# Patient Record
Sex: Male | Born: 1970 | Race: Black or African American | Hispanic: No | Marital: Single | Smoking: Never smoker
Health system: Southern US, Community
[De-identification: ages and names within clinical notes are randomized; demographics above are authoritative.]

## PROBLEM LIST (undated history)

## (undated) ENCOUNTER — Ambulatory Visit (HOSPITAL_COMMUNITY): Admission: EM | Payer: Managed Care, Other (non HMO)

---

## 1999-08-13 ENCOUNTER — Emergency Department (HOSPITAL_COMMUNITY): Admission: EM | Admit: 1999-08-13 | Discharge: 1999-08-13 | Payer: Self-pay | Admitting: *Deleted

## 2003-07-17 ENCOUNTER — Other Ambulatory Visit: Payer: Self-pay

## 2005-06-04 ENCOUNTER — Emergency Department (HOSPITAL_COMMUNITY): Admission: EM | Admit: 2005-06-04 | Discharge: 2005-06-04 | Payer: Self-pay | Admitting: Emergency Medicine

## 2007-07-03 ENCOUNTER — Ambulatory Visit (HOSPITAL_COMMUNITY): Admission: RE | Admit: 2007-07-03 | Discharge: 2007-07-03 | Payer: Self-pay | Admitting: Orthopedic Surgery

## 2007-10-11 ENCOUNTER — Ambulatory Visit: Payer: Self-pay | Admitting: Internal Medicine

## 2007-10-11 ENCOUNTER — Inpatient Hospital Stay (HOSPITAL_COMMUNITY): Admission: RE | Admit: 2007-10-11 | Discharge: 2007-10-12 | Payer: Self-pay | Admitting: Orthopedic Surgery

## 2007-10-19 ENCOUNTER — Ambulatory Visit: Payer: Self-pay | Admitting: Pulmonary Disease

## 2007-10-19 DIAGNOSIS — J209 Acute bronchitis, unspecified: Secondary | ICD-10-CM

## 2007-10-19 DIAGNOSIS — J8409 Other alveolar and parieto-alveolar conditions: Secondary | ICD-10-CM | POA: Insufficient documentation

## 2009-09-17 IMAGING — CR DG LUMBAR SPINE 2-3V
2 series · 2 of 2 positions shown · non-contrast
Comparison: Lumbar MRI performed on 09/07/2007 at [REDACTED].

CLINICAL DATA: Herniated L5 - S1 disc.  Low back pain.

LUMBAR SPINE - 2-3 VIEW

[t l-spine a.p.]
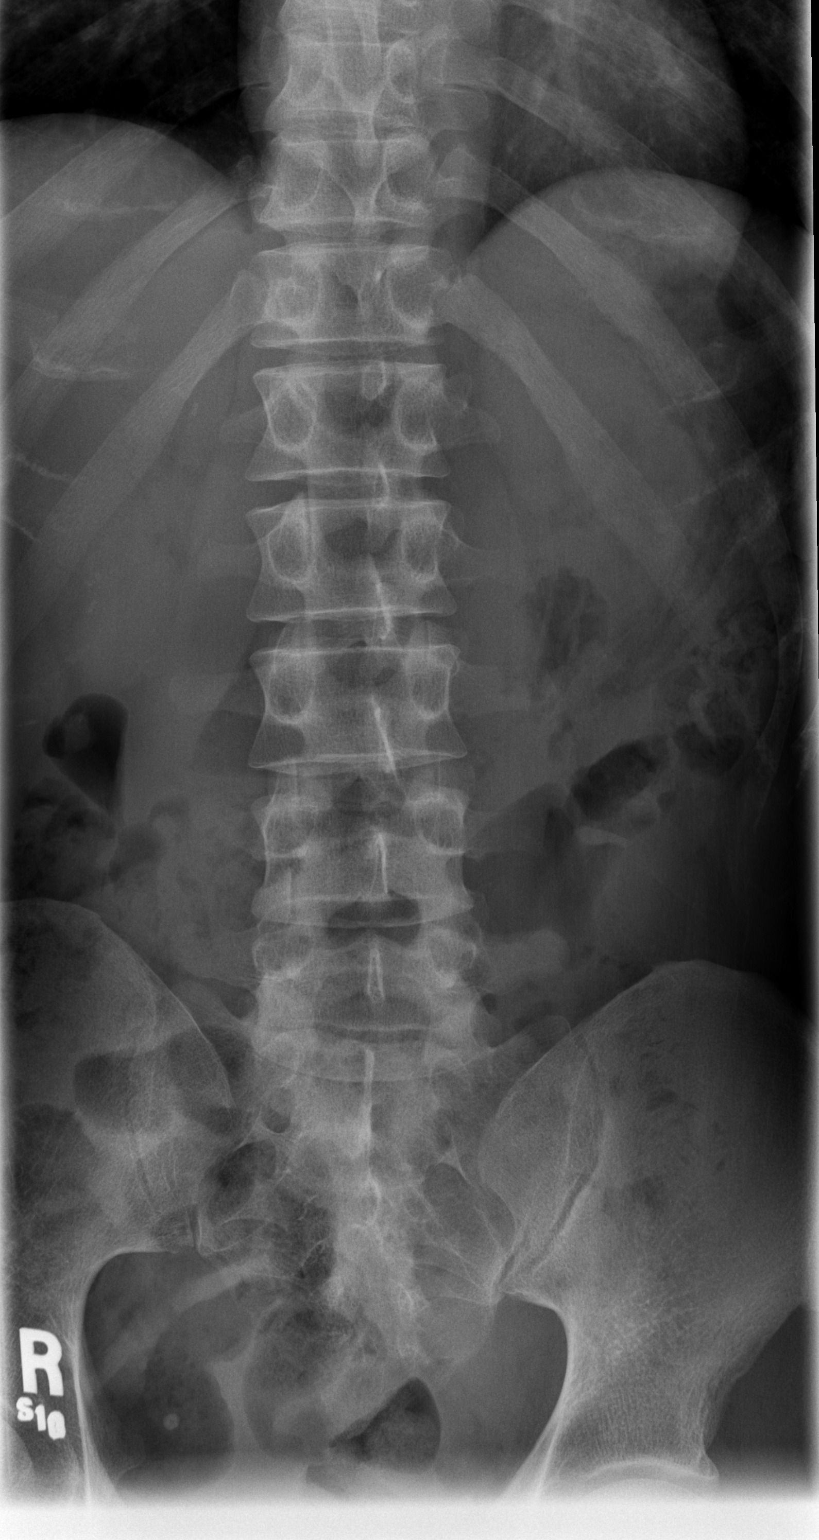

[t l-spine lat]
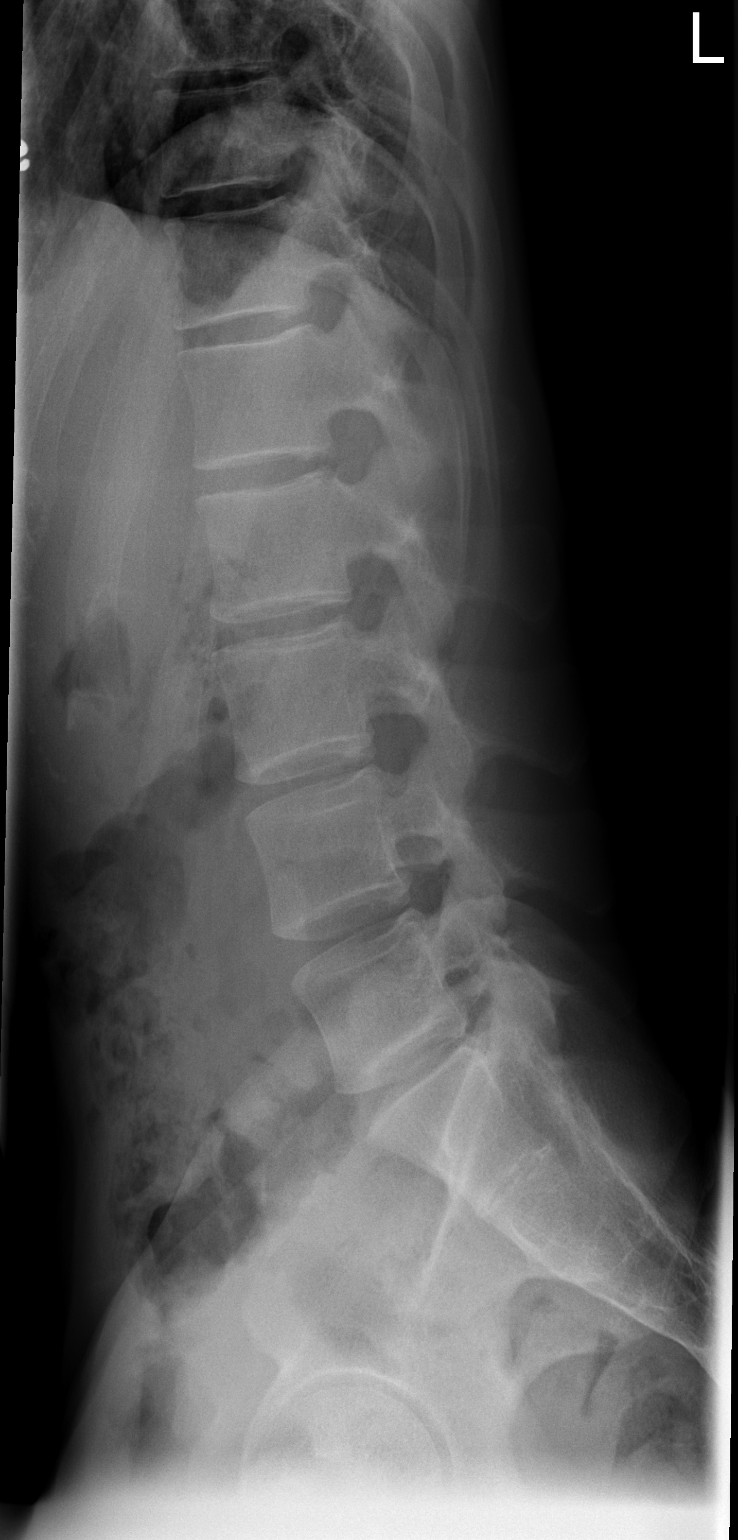

[2 of 2 positions shown; findings below may reference images not displayed]

FINDINGS: I have labeled the AP and lateral radiographs using the
same numbering sequence as utilized for the interpretation of the
lumbar MRI.

There are five typical lumbar segments with no spondylolisthesis or
spondylolysis.  No disc space narrowing or appreciable facet joint
disease.
IMPRESSION: Normal lumbar spine.  Radiographs labeled as described.

## 2009-09-17 IMAGING — CR DG OR PORTABLE SPINE
1 series · 1 of 1 positions shown · non-contrast
Comparison: Previous intraoperative film of same date.

CLINICAL DATA: Right disc extrusion at L5-S1

PORTABLE SPINE

[view not recorded]
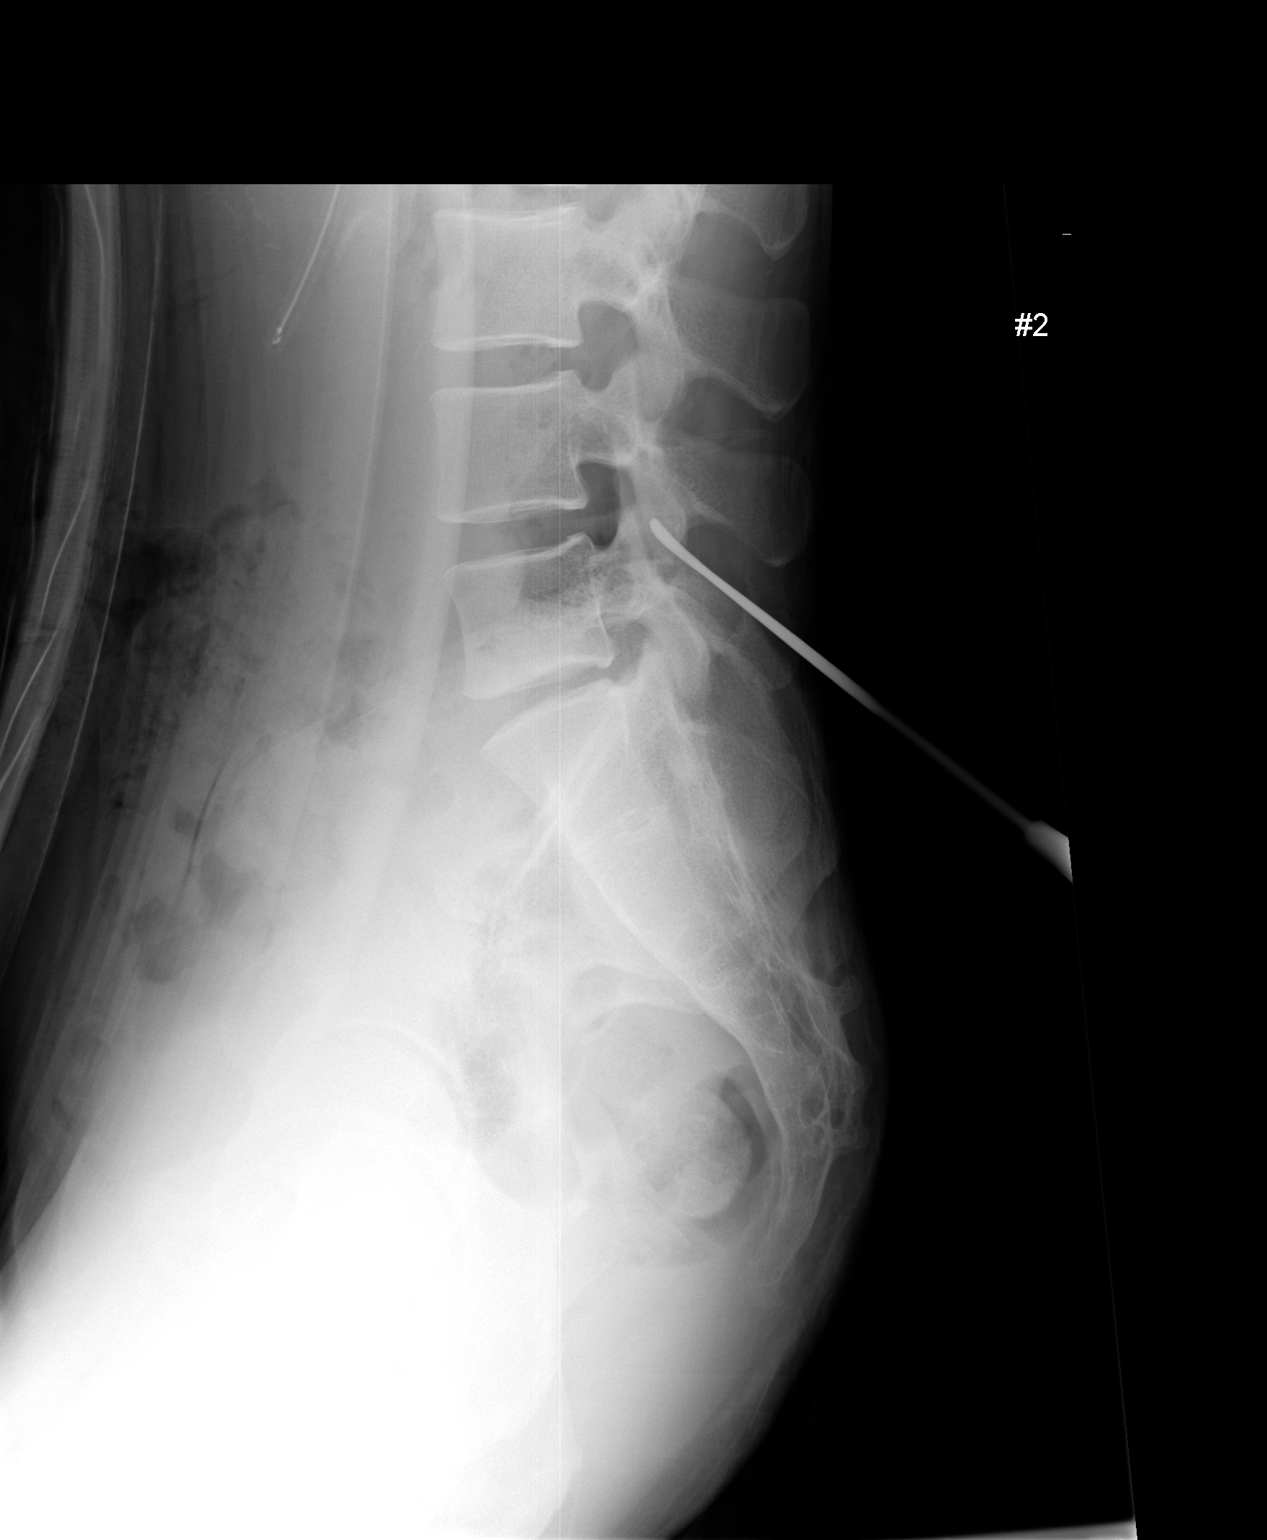

[1 of 1 positions shown; findings below may reference images not displayed]

FINDINGS: A surgical probe was directed at the L4-5 level.  The
vertebral bodies are numbered.
IMPRESSION: The surgical probe is directed at the L4-5 level.

## 2009-09-17 IMAGING — CR DG OR PORTABLE SPINE
1 series · 1 of 1 positions shown · non-contrast
Comparison: Earlier intraoperative exams from the same day.

CLINICAL DATA: 37-year-old male undergoing lumbar spine surgery.

PORTABLE SPINE

[view not recorded]
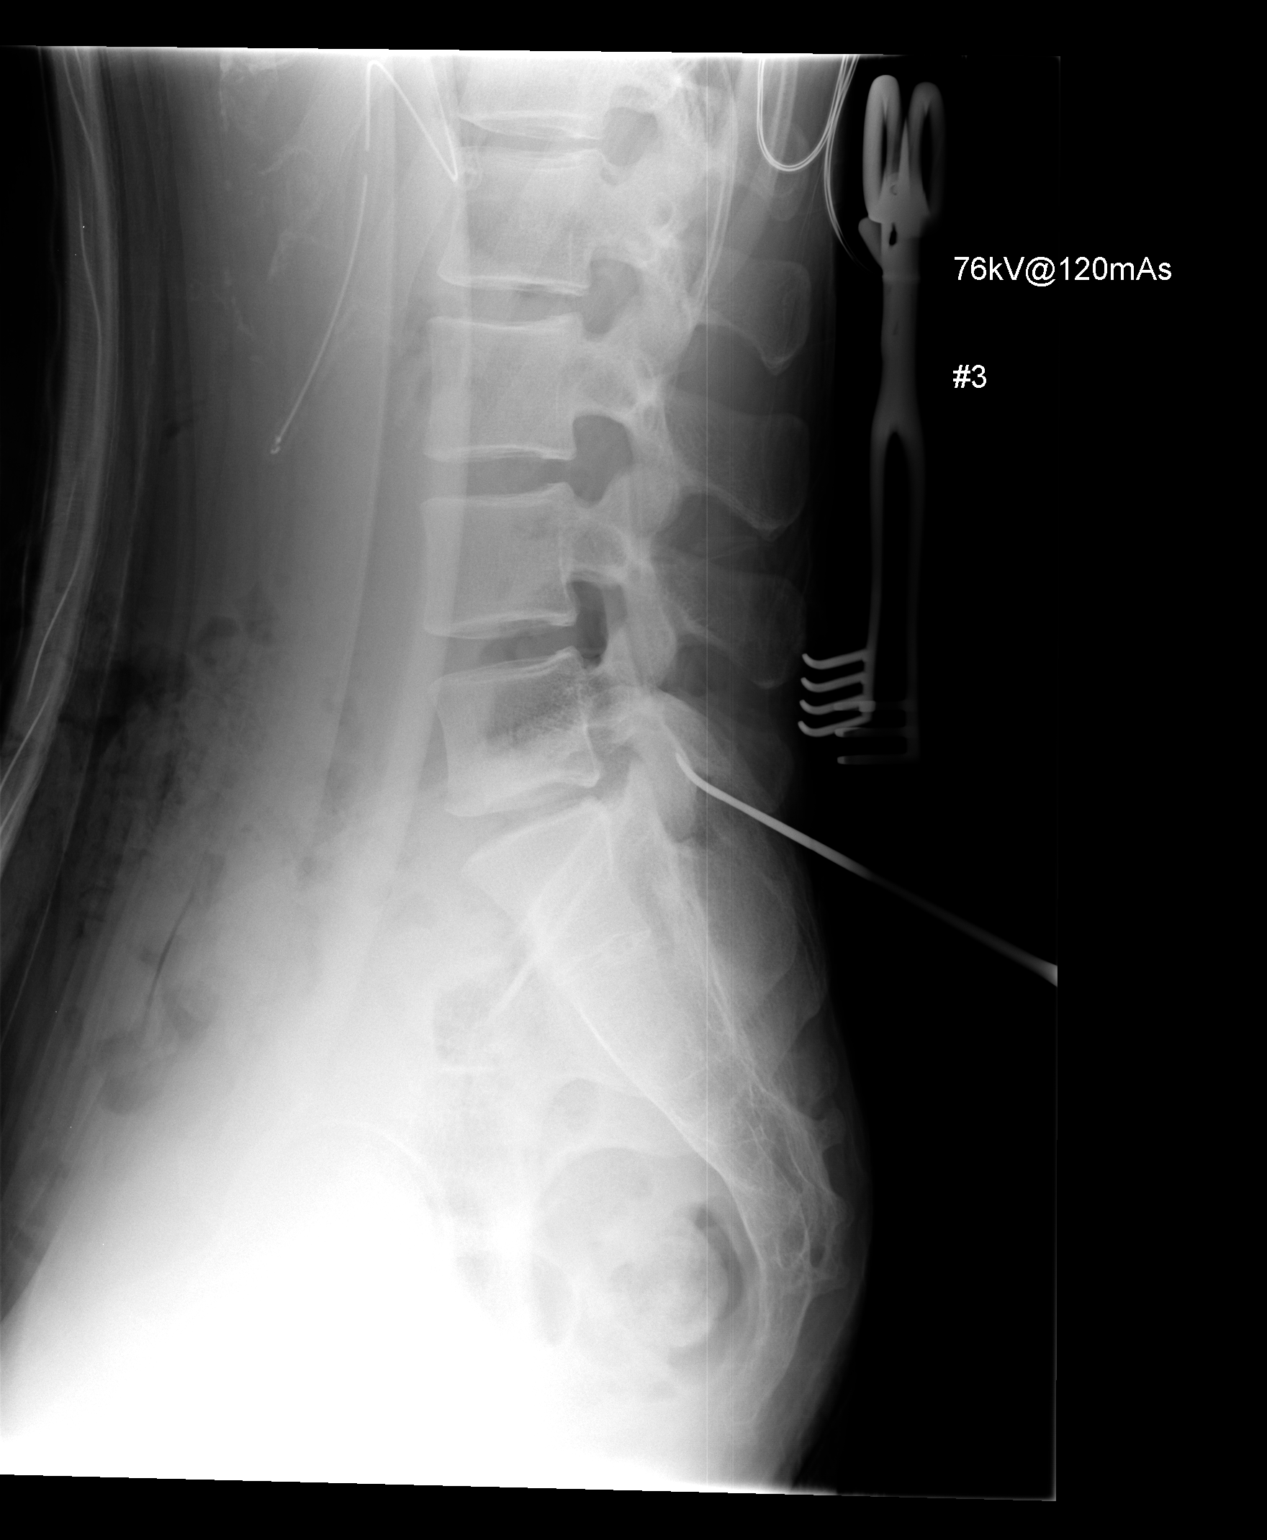

[1 of 1 positions shown; findings below may reference images not displayed]

FINDINGS: Portable lateral intraoperative view the spine labeled #3
at 7253 hours.

Surgical probe now projects over the L5 inferior articulating facet
at the level of the L5-S1 disc space.
IMPRESSION: Intraoperative localization as above.

## 2009-09-17 IMAGING — CR DG OR PORTABLE SPINE
1 series · 1 of 1 positions shown · non-contrast
Comparison: Plain film radiographs of same date.  I do not have any
cross sectional imaging with which to compare.

CLINICAL DATA: Right-sided disc extrusion at L5-S1.

PORTABLE SPINE

[view not recorded]
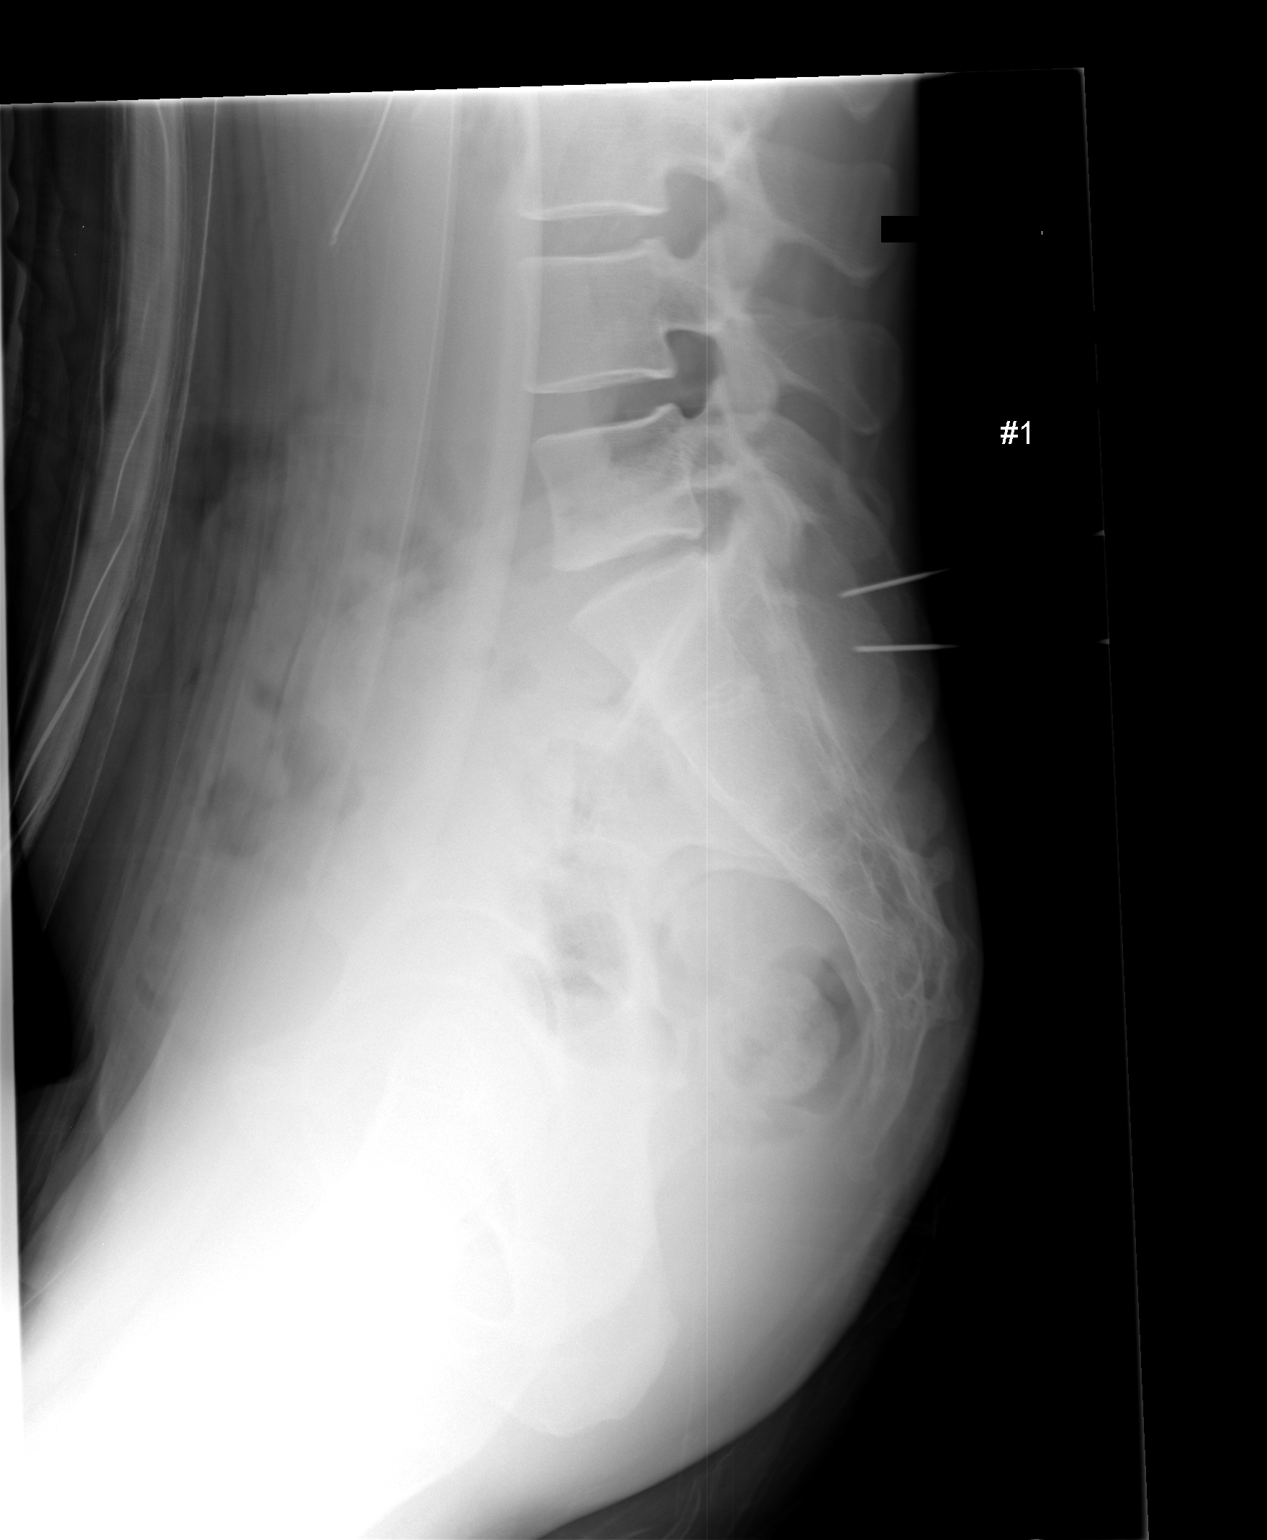

[1 of 1 positions shown; findings below may reference images not displayed]

FINDINGS: Using the same numbering system as was used on the
previous plain film radiographs, there are two needles directed at
the body of S1 and the S1-2 level respectively.
IMPRESSION: 1.  Needles are directed at S1 and S1-2 interspaces respectively.

## 2010-07-28 NOTE — Op Note (Signed)
NAME:  Theodore Young, Theodore Young                ACCOUNT NO.:  [id number]   MEDICAL RECORD NO.:  [id number]          PATIENT TYPE:  OIB   LOCATION:  0098                         FACILITY:  [facility]   PHYSICIAN:  Alvy Beal, MD    DATE OF BIRTH:  [birth year]   DATE OF PROCEDURE:  DATE OF DISCHARGE:                               OPERATIVE REPORT   PREOPERATIVE DIAGNOSIS:  Right paracentral disk herniation, L5-S1.   POSTOPERATIVE DIAGNOSIS:  Right paracentral disk herniation, L5-S1.   OPERATIVE PROCEDURE:  Right sided L5-S1 lumbar hemilaminectomy for  diskectomy, CPT code 62952.   COMPLICATIONS:  None.   INTRAOPERATIVE FINDINGS:  Significant posterolateral disk herniation  removed under direct visualization.  No CSF leak post removal, no direct  neural injury.   OPERATIVE INDICATIONS:  The patient is a very pleasant 40 year old  gentleman who presented to my office some time ago after a work-related  injury, resulted in significant back and left leg pain.  Initially, his  MRI did not confirm a left-sided disk herniation, and he was treated  appropriately conservatively.  However, he then developed severe  unrelenting right leg pain and a repeat MRI was done that demonstrated a  larger paracentral disk herniation posterolateral to the right at L5-S1  with increased pressure against the L5-S1 traversing nerve root.  After  discussing treatment options, we elected to proceed with surgery.  All  appropriate conservative measures, including physiotherapy, narcotic  medications and injection therapy were tried and failed.   FIRST ASSISTANT:  Crissie Reese, PA.   COMPLICATIONS:  None.   OPERATIVE NOTE:  The patient was brought to the operating room and  placed supine on the operating table.  After the successful induction of  general anesthesia, endotracheal intubation, TEDs and SCDs were applied,  he was turned prone onto a Wilson frame.  All bony prominences were well-  padded.  Two needles  were placed into the skin to mark for skin  incision.  I made a skin incision in the midline centered over the L5-S1  disk space.  Sharp dissection was carried out down to the deep fascia.  The deep fascia was sharply incised on the right-hand side, and I  exposed the L5-S1 interlaminar space.  I placed a Penfield IV underneath  the lamina and took an x-ray.  At this point, I realized that I was one  level off, and I was underneath the L4 lamina.  I then adjusted my  dissection, exposed the true L5-S1 interbody space, put a Penfield IV  underneath the L5 lamina and repeated the x-ray.  This time, I confirmed  that I was at the L5 lamina at the L5-S1 disk space.  At this point, a  Taylor retractor was placed to retract and I performed a slight  hemilaminotomy of L5 with a 3-mm Kerrison.  I then resected the  ligamentum flavum by removing it from the insertion site off of S1, and  then exposed the lateral aspect of the thecal sac.  Using a Penfield IV,  I dissected down the lateral gutter.  I had noted posterior  and medial  displacement of the traversing S1 nerve root at this time.  I could  easily sweep the thecal sac and displaced nerve root medially and  protect it with a D'Errico.  I then coagulated the traversing epidural  veins with bipolar and then incised the annulus using a combination of  pituitary rongeurs and reversed angled Epstein curettes.  I resected the  fragments of disk that had herniated posteriorly.  At this point, I then  used a micro-pituitary rongeur to enter into the actual interbody space  to remove any loose fragments of disk material that were still within  the disk space itself.  At this point, I then took a nerve hook and I  was able to sweep in a circumferential manner underneath the annulus,  confirming that there was no retained free fragments that were still  evident.  I then swept between the annulus and thecal sac, confirming  that there was no retained  fragments of material.  I then took a dural  elevator (hockeystick) and I was able to pass down the lateral recess to  the exiting S1 nerve root and neural foramen to confirm that there was  no significant pressure on the traversing S1 nerve root at the disk  level of the disk space and caudal.  I then swept cranially laterally  and medially in a circumferential manner, confirming that there were no  free fragments of disk material.  Once I had an adequate diskectomy, I  then irrigated copiously with normal saline, obtained hemostasis using  bipolar and then maintained with a thrombin-soaked Gelfoam patty.  I  then closed the deep fascia with interrupted #1 Vicryl sutures,  superficial 2-0 Vicryl sutures and 3-0 Monocryl for the skin.  Steri-  Strips, dry dressing and a Mepilex was applied.  The patient was  extubated and transferred to the PACU without incident.  At the end of  the case, all needle and sponge counts were correct.      Alvy Beal, MD  Electronically Signed     DDB/MEDQ  D:  10/10/2007  T:  10/10/2007  Job:  [id number]

## 2010-07-28 NOTE — Discharge Summary (Signed)
NAME:  Theodore Young, Gibbon                ACCOUNT NO.:  [id number]   MEDICAL RECORD NO.:  [id number]          PATIENT TYPE:  INP   LOCATION:  1608                         FACILITY:  [facility]   PHYSICIAN:  Alvy Beal, MD    DATE OF BIRTH:  08-28-1970   DATE OF ADMISSION:  10/10/2007  DATE OF DISCHARGE:  10/12/2007                               DISCHARGE SUMMARY   ADMISSION DIAGNOSIS:  L5-S1 right herniated nucleus pulposus with right  leg pain.   DISCHARGE DIAGNOSES:  L5-S1 right herniated nucleus pulposus with right  leg pain with episode of pulmonary edema.   CONSULT:  One consult was obtained critical care and management.   PROCEDURE:  Right-sided L5-S1 lumbar hemilaminectomy for diskectomy.   BRIEF HISTORY:  The patient is a very pleasant 40 year old gentleman who  initially presented to Dr. Shon Baton office after a work related injury and  that resulted in a significant back and left leg pain.  Initially, his  MRI did not confirm the left-sided disk herniation and he was treated  appropriately and conservatively.  However, he did develop unrelenting  right leg pain and a repeat MRI was done that demonstrated a larger  paracentral disk herniation in posterolateral to the right at L5-S1 with  increased pressure against the L5-S1 transversing nerve root.  After  discussing treatment options, the patient elected to proceed with  surgery.  All appropriate conservative measures including physical  therapy, narcotic medications, and injections were tried and failed  prior to surgery.   HOSPITAL COURSE:  Hospital course was 2 days in length.  The patient did  well in the PACU was having some mild complications of shortness of  breath and an episode of pink frothy sputum.  A chest x-ray was  obtained.  It demonstrated some pulmonary edema.  Critical care was  consulted to help manage this patient.  The patient stayed one night in  the SICU and a repeat chest x-ray following two doses of  Lasix showed  that the pulmonary edema was resolving.  He was therefore deemed stable  to be transferred to the orthopedic floor. Postoperatively day #2, the  patient was tolerating a regular diet.  Had no shortness of breath.  His  O2 sats were 95% on room air.  The patient was able to ambulate without  assistance.  He was having regular bowel and bladder movements.  The  patient was therefore deemed stable to be discharged to home.  The  patient's discharge condition is going to be stable.   DISCHARGE MEDICATIONS:  1. Percocet 10/325 1 tablet p.o. q. 4-6 h. p.r.n. pain.  2. Robaxin 500 mg 1 tablet p.o. q.8 h. p.r.n. muscle spasms.   INSTRUCTIONS:  The patient was given a preprinted discharge instruction  form that included the restrictions of activity as walk as tolerated,  not to lift anything heavier than 8 pounds, he is not to drive until he  is not taking pain medications, he is to wear the brace out of bed for  comfort only, he can shower 2 days after surgery, he is to  follow up in  the office 2 weeks after surgery.  The patient is to call and schedule  his appointment.  It is also recommended  that the patient follow up with the [facility] so he can  have a primary care physician to follow up for his episode of pulmonary  edema.  The patient will receive the phone number and the patient needs  to make this appointment.      Crissie Reese, PA       ______________________________  Alvy Beal, MD    AC/MEDQ  D:  10/12/2007  T:  10/12/2007  Job:  [id number]

## 2010-12-11 LAB — CBC
Hemoglobin: 17.6 — ABNORMAL HIGH
MCHC: 34.8
MCV: 96.7
RBC: 5.24

## 2010-12-11 LAB — DIFFERENTIAL
Basophils Relative: 0
Eosinophils Absolute: 0
Monocytes Absolute: 0.5
Monocytes Relative: 3
Neutrophils Relative %: 93 — ABNORMAL HIGH

## 2010-12-11 LAB — BASIC METABOLIC PANEL
CO2: 23
Calcium: 9.1
Creatinine, Ser: 1.01
GFR calc Af Amer: >60

## 2010-12-11 LAB — COMPREHENSIVE METABOLIC PANEL
BUN: 19
Calcium: 9.2
Glucose, Bld: 165 — ABNORMAL HIGH
Total Protein: 6.2

## 2010-12-11 LAB — SEDIMENTATION RATE: Sed Rate: 0

## 2010-12-11 LAB — CARDIAC PANEL(CRET KIN+CKTOT+MB+TROPI): Total CK: 509 — ABNORMAL HIGH

## 2010-12-11 LAB — LACTATE DEHYDROGENASE: LDH: 157

## 2020-01-16 ENCOUNTER — Ambulatory Visit (INDEPENDENT_AMBULATORY_CARE_PROVIDER_SITE_OTHER): Payer: Managed Care, Other (non HMO)

## 2020-01-16 ENCOUNTER — Ambulatory Visit
Admission: EM | Admit: 2020-01-16 | Discharge: 2020-01-16 | Disposition: A | Discharge status: Home / Self Care | Payer: Managed Care, Other (non HMO) | Attending: Family Medicine | Admitting: Family Medicine

## 2020-01-16 ENCOUNTER — Other Ambulatory Visit: Payer: Self-pay

## 2020-01-16 DIAGNOSIS — M79672 Pain in left foot: Secondary | ICD-10-CM

## 2020-01-16 DIAGNOSIS — R202 Paresthesia of skin: Secondary | ICD-10-CM | POA: Diagnosis not present

## 2020-01-16 DIAGNOSIS — S99922A Unspecified injury of left foot, initial encounter: Secondary | ICD-10-CM | POA: Diagnosis not present

## 2020-01-16 MED ORDER — PREDNISONE 10 MG (21) PO TBPK: ORAL_TABLET | Freq: Every day | ORAL | 0 refills | Status: AC
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
## 2021-03-16 ENCOUNTER — Other Ambulatory Visit: Payer: Self-pay
